# Patient Record
Sex: Female | Born: 1980 | Race: Black or African American | Hispanic: No | Marital: Single | Smoking: Current every day smoker
Health system: Southern US, Community
[De-identification: ages and names within clinical notes are randomized; demographics above are authoritative.]

## PROBLEM LIST (undated history)

## (undated) DIAGNOSIS — M329 Systemic lupus erythematosus, unspecified: Secondary | ICD-10-CM

## (undated) DIAGNOSIS — R569 Unspecified convulsions: Secondary | ICD-10-CM

---

## 2004-03-17 ENCOUNTER — Emergency Department: Payer: Self-pay | Admitting: General Practice

## 2005-08-13 ENCOUNTER — Emergency Department: Payer: Self-pay | Admitting: Emergency Medicine

## 2005-10-06 ENCOUNTER — Emergency Department: Payer: Self-pay | Admitting: Emergency Medicine

## 2005-11-29 ENCOUNTER — Emergency Department (HOSPITAL_COMMUNITY): Admission: EM | Admit: 2005-11-29 | Discharge: 2005-11-29 | Payer: Self-pay | Admitting: Emergency Medicine

## 2006-06-03 ENCOUNTER — Emergency Department (HOSPITAL_COMMUNITY): Admission: EM | Admit: 2006-06-03 | Discharge: 2006-06-04 | Payer: Self-pay | Admitting: Emergency Medicine

## 2006-12-29 ENCOUNTER — Emergency Department (HOSPITAL_COMMUNITY): Admission: EM | Admit: 2006-12-29 | Discharge: 2006-12-29 | Payer: Self-pay | Admitting: *Deleted

## 2007-04-12 ENCOUNTER — Emergency Department (HOSPITAL_COMMUNITY): Admission: EM | Admit: 2007-04-12 | Discharge: 2007-04-12 | Payer: Self-pay | Admitting: Emergency Medicine

## 2007-06-09 ENCOUNTER — Emergency Department (HOSPITAL_COMMUNITY): Admission: EM | Admit: 2007-06-09 | Discharge: 2007-06-09 | Payer: Self-pay | Admitting: Emergency Medicine

## 2007-09-25 ENCOUNTER — Emergency Department (HOSPITAL_COMMUNITY): Admission: EM | Admit: 2007-09-25 | Discharge: 2007-09-26 | Payer: Self-pay | Admitting: Emergency Medicine

## 2009-06-15 ENCOUNTER — Emergency Department (HOSPITAL_COMMUNITY): Admission: EM | Admit: 2009-06-15 | Discharge: 2009-06-15 | Payer: Self-pay | Admitting: Emergency Medicine

## 2009-06-22 ENCOUNTER — Emergency Department (HOSPITAL_COMMUNITY): Admission: EM | Admit: 2009-06-22 | Discharge: 2009-06-22 | Payer: Self-pay | Admitting: Emergency Medicine

## 2009-09-07 ENCOUNTER — Emergency Department (HOSPITAL_COMMUNITY)
Admission: EM | Admit: 2009-09-07 | Discharge: 2009-09-07 | Payer: Self-pay | Source: Home / Self Care | Admitting: Emergency Medicine

## 2009-09-23 ENCOUNTER — Emergency Department (HOSPITAL_COMMUNITY): Admission: EM | Admit: 2009-09-23 | Discharge: 2009-09-23 | Payer: Self-pay | Admitting: Emergency Medicine

## 2010-03-12 ENCOUNTER — Emergency Department (HOSPITAL_COMMUNITY): Admission: EM | Admit: 2010-03-12 | Discharge: 2010-03-13 | Payer: Self-pay | Admitting: Emergency Medicine

## 2010-07-10 LAB — WET PREP, GENITAL
Trich, Wet Prep: NONE SEEN
Yeast Wet Prep HPF POC: NONE SEEN

## 2010-07-10 LAB — GC/CHLAMYDIA PROBE AMP, GENITAL
Chlamydia, DNA Probe: NEGATIVE
GC Probe Amp, Genital: NEGATIVE

## 2010-07-10 LAB — URINALYSIS, ROUTINE W REFLEX MICROSCOPIC
Bilirubin Urine: NEGATIVE
Glucose, UA: NEGATIVE mg/dL
Hgb urine dipstick: NEGATIVE
Ketones, ur: NEGATIVE mg/dL
Protein, ur: NEGATIVE mg/dL

## 2010-07-10 LAB — URINE MICROSCOPIC-ADD ON

## 2010-07-10 LAB — POCT PREGNANCY, URINE: Preg Test, Ur: NEGATIVE

## 2010-07-17 LAB — CBC
MCHC: 33.8 g/dL (ref 30.0–36.0)
MCV: 93.3 fL (ref 78.0–100.0)
RBC: 3.84 MIL/uL — ABNORMAL LOW (ref 3.87–5.11)
RDW: 14.6 % (ref 11.5–15.5)

## 2010-07-17 LAB — DIFFERENTIAL
Basophils Relative: 1 % (ref 0–1)
Eosinophils Absolute: 0.1 10*3/uL (ref 0.0–0.7)
Monocytes Absolute: 0.5 10*3/uL (ref 0.1–1.0)
Monocytes Relative: 11 % (ref 3–12)
Neutrophils Relative %: 42 % — ABNORMAL LOW (ref 43–77)

## 2010-07-17 LAB — BASIC METABOLIC PANEL
BUN: 9 mg/dL (ref 6–23)
Potassium: 4.3 mEq/L (ref 3.5–5.1)
Sodium: 134 mEq/L — ABNORMAL LOW (ref 135–145)

## 2010-08-06 ENCOUNTER — Emergency Department (HOSPITAL_COMMUNITY)
Admission: EM | Admit: 2010-08-06 | Discharge: 2010-08-06 | Disposition: A | Discharge status: Home / Self Care | Payer: Self-pay | Attending: Emergency Medicine | Admitting: Emergency Medicine

## 2010-08-06 DIAGNOSIS — M329 Systemic lupus erythematosus, unspecified: Secondary | ICD-10-CM | POA: Insufficient documentation

## 2010-08-06 DIAGNOSIS — Z79899 Other long term (current) drug therapy: Secondary | ICD-10-CM | POA: Insufficient documentation

## 2010-08-06 DIAGNOSIS — R319 Hematuria, unspecified: Secondary | ICD-10-CM | POA: Insufficient documentation

## 2010-08-06 DIAGNOSIS — N390 Urinary tract infection, site not specified: Secondary | ICD-10-CM | POA: Insufficient documentation

## 2010-08-06 DIAGNOSIS — R21 Rash and other nonspecific skin eruption: Secondary | ICD-10-CM | POA: Insufficient documentation

## 2010-08-06 DIAGNOSIS — IMO0001 Reserved for inherently not codable concepts without codable children: Secondary | ICD-10-CM | POA: Insufficient documentation

## 2010-08-06 LAB — POCT PREGNANCY, URINE: Preg Test, Ur: NEGATIVE

## 2010-08-06 LAB — URINALYSIS, ROUTINE W REFLEX MICROSCOPIC
Ketones, ur: 15 mg/dL — AB
Nitrite: NEGATIVE
Urobilinogen, UA: 1 mg/dL (ref 0.0–1.0)

## 2010-08-06 LAB — CBC
MCH: 30.3 pg (ref 26.0–34.0)
MCHC: 34 g/dL (ref 30.0–36.0)
Platelets: 280 10*3/uL (ref 150–400)

## 2010-08-06 LAB — DIFFERENTIAL
Basophils Relative: 0 % (ref 0–1)
Eosinophils Absolute: 0 10*3/uL (ref 0.0–0.7)
Monocytes Absolute: 0.7 10*3/uL (ref 0.1–1.0)
Monocytes Relative: 9 % (ref 3–12)
Neutrophils Relative %: 55 % (ref 43–77)

## 2010-08-06 LAB — URINE MICROSCOPIC-ADD ON

## 2010-08-06 LAB — BASIC METABOLIC PANEL
BUN: 12 mg/dL (ref 6–23)
Creatinine, Ser: 0.97 mg/dL (ref 0.4–1.2)
GFR calc non Af Amer: >60 mL/min (ref >60)

## 2010-08-06 LAB — CK: Total CK: 136 U/L (ref 7–177)

## 2010-08-07 LAB — URINE CULTURE: Culture: NO GROWTH

## 2010-09-01 ENCOUNTER — Emergency Department (HOSPITAL_COMMUNITY)
Admission: EM | Admit: 2010-09-01 | Discharge: 2010-09-01 | Disposition: A | Discharge status: Home / Self Care | Payer: Self-pay | Attending: Emergency Medicine | Admitting: Emergency Medicine

## 2010-09-01 DIAGNOSIS — N760 Acute vaginitis: Secondary | ICD-10-CM | POA: Insufficient documentation

## 2010-09-01 DIAGNOSIS — R109 Unspecified abdominal pain: Secondary | ICD-10-CM | POA: Insufficient documentation

## 2010-09-01 DIAGNOSIS — M545 Low back pain, unspecified: Secondary | ICD-10-CM | POA: Insufficient documentation

## 2010-09-01 LAB — URINE MICROSCOPIC-ADD ON

## 2010-09-01 LAB — URINALYSIS, ROUTINE W REFLEX MICROSCOPIC
Glucose, UA: NEGATIVE mg/dL
Hgb urine dipstick: NEGATIVE
Protein, ur: NEGATIVE mg/dL
pH: 5.5 (ref 5.0–8.0)

## 2010-09-01 LAB — WET PREP, GENITAL: Yeast Wet Prep HPF POC: NONE SEEN

## 2011-01-23 LAB — POCT I-STAT, CHEM 8
Chloride: 104
Glucose, Bld: 82
HCT: 39
Potassium: 4.7

## 2011-01-23 LAB — CBC
MCHC: 34.1
RBC: 4.07
WBC: 4.8

## 2011-01-23 LAB — DIFFERENTIAL
Basophils Relative: 1
Lymphocytes Relative: 36
Monocytes Relative: 9
Neutro Abs: 2.6
Neutrophils Relative %: 54

## 2011-02-08 LAB — URINALYSIS, ROUTINE W REFLEX MICROSCOPIC
Nitrite: NEGATIVE
Specific Gravity, Urine: 1.023
Urobilinogen, UA: 1

## 2011-02-08 LAB — GC/CHLAMYDIA PROBE AMP, GENITAL: Chlamydia, DNA Probe: NEGATIVE

## 2011-02-08 LAB — URINE MICROSCOPIC-ADD ON

## 2011-02-08 LAB — PREGNANCY, URINE: Preg Test, Ur: NEGATIVE

## 2011-02-08 LAB — RPR: RPR Ser Ql: NONREACTIVE

## 2011-03-14 ENCOUNTER — Encounter: Payer: Self-pay | Admitting: Emergency Medicine

## 2011-03-14 ENCOUNTER — Emergency Department (HOSPITAL_COMMUNITY): Payer: PRIVATE HEALTH INSURANCE

## 2011-03-14 ENCOUNTER — Emergency Department (HOSPITAL_COMMUNITY)
Admission: EM | Admit: 2011-03-14 | Discharge: 2011-03-14 | Disposition: A | Discharge status: Home / Self Care | Payer: PRIVATE HEALTH INSURANCE | Attending: Emergency Medicine | Admitting: Emergency Medicine

## 2011-03-14 DIAGNOSIS — IMO0001 Reserved for inherently not codable concepts without codable children: Secondary | ICD-10-CM | POA: Insufficient documentation

## 2011-03-14 DIAGNOSIS — R51 Headache: Secondary | ICD-10-CM | POA: Insufficient documentation

## 2011-03-14 DIAGNOSIS — R509 Fever, unspecified: Secondary | ICD-10-CM | POA: Insufficient documentation

## 2011-03-14 DIAGNOSIS — R079 Chest pain, unspecified: Secondary | ICD-10-CM | POA: Insufficient documentation

## 2011-03-14 DIAGNOSIS — G40909 Epilepsy, unspecified, not intractable, without status epilepticus: Secondary | ICD-10-CM | POA: Insufficient documentation

## 2011-03-14 DIAGNOSIS — R059 Cough, unspecified: Secondary | ICD-10-CM | POA: Insufficient documentation

## 2011-03-14 DIAGNOSIS — Z79899 Other long term (current) drug therapy: Secondary | ICD-10-CM | POA: Insufficient documentation

## 2011-03-14 DIAGNOSIS — N390 Urinary tract infection, site not specified: Secondary | ICD-10-CM | POA: Insufficient documentation

## 2011-03-14 DIAGNOSIS — M329 Systemic lupus erythematosus, unspecified: Secondary | ICD-10-CM | POA: Insufficient documentation

## 2011-03-14 DIAGNOSIS — J3489 Other specified disorders of nose and nasal sinuses: Secondary | ICD-10-CM | POA: Insufficient documentation

## 2011-03-14 DIAGNOSIS — J069 Acute upper respiratory infection, unspecified: Secondary | ICD-10-CM | POA: Insufficient documentation

## 2011-03-14 DIAGNOSIS — R05 Cough: Secondary | ICD-10-CM | POA: Insufficient documentation

## 2011-03-14 HISTORY — DX: Systemic lupus erythematosus, unspecified: M32.9

## 2011-03-14 HISTORY — DX: Unspecified convulsions: R56.9

## 2011-03-14 LAB — POCT I-STAT, CHEM 8
Calcium, Ion: 1.17 mmol/L (ref 1.12–1.32)
Chloride: 103 mEq/L (ref 96–112)
HCT: 38 % (ref 36.0–46.0)
Potassium: 3.8 mEq/L (ref 3.5–5.1)

## 2011-03-14 LAB — URINALYSIS, ROUTINE W REFLEX MICROSCOPIC
Glucose, UA: NEGATIVE mg/dL
pH: 7.5 (ref 5.0–8.0)

## 2011-03-14 LAB — URINE MICROSCOPIC-ADD ON

## 2011-03-14 LAB — CBC
Hemoglobin: 12.2 g/dL (ref 12.0–15.0)
RBC: 3.95 MIL/uL (ref 3.87–5.11)

## 2011-03-14 LAB — POCT PREGNANCY, URINE: Preg Test, Ur: NEGATIVE

## 2011-03-14 LAB — RAPID STREP SCREEN (MED CTR MEBANE ONLY): Streptococcus, Group A Screen (Direct): NEGATIVE

## 2011-03-14 MED ORDER — ETODOLAC 500 MG PO TABS: 500.0000 mg | ORAL_TABLET | Freq: Two times a day (BID) | ORAL | Status: DC

## 2011-03-14 MED ORDER — HYDROMORPHONE HCL PF 1 MG/ML IJ SOLN
1.0000 mg | Freq: Once | INTRAMUSCULAR | Status: AC
  Administered 2011-03-14: 1 mg via INTRAMUSCULAR
  Filled 2011-03-14: qty 1

## 2011-03-14 MED ORDER — OXYCODONE-ACETAMINOPHEN 5-325 MG PO TABS: 1.0000 | ORAL_TABLET | Freq: Once | ORAL | Status: DC

## 2011-03-14 MED ORDER — HYDROCODONE-ACETAMINOPHEN 5-325 MG PO TABS: 1.0000 | ORAL_TABLET | Freq: Four times a day (QID) | ORAL | Status: AC | PRN

## 2011-03-14 MED ORDER — SULFAMETHOXAZOLE-TRIMETHOPRIM 800-160 MG PO TABS: 1.0000 | ORAL_TABLET | Freq: Two times a day (BID) | ORAL | Status: AC

## 2011-03-14 MED ORDER — GUAIFENESIN ER 1200 MG PO TB12: 1.0000 | ORAL_TABLET | Freq: Two times a day (BID) | ORAL | Status: DC

## 2011-03-14 MED ORDER — DIPHENHYDRAMINE HCL 50 MG/ML IJ SOLN
25.0000 mg | Freq: Once | INTRAMUSCULAR | Status: AC
  Administered 2011-03-14: 25 mg via INTRAMUSCULAR
  Filled 2011-03-14: qty 1

## 2011-03-14 NOTE — ED Notes (Signed)
Doctor at bedside.

## 2011-03-14 NOTE — ED Provider Notes (Signed)
History     CSN: [id number] Arrival date & time: 03/14/2011 10:05 AM   First MD Initiated Contact with Patient 03/14/11 1009      Chief Complaint  Patient presents with  . Cough    (Consider location/radiation/quality/duration/timing/severity/associated sxs/prior treatment) HPI Comments: Patient states she has history of lupus and seizure disorder. She does not have a primary care doctor. She's been feeling aches and pains all over and thinks some of this could be related to her lupus. Patient however has been having coughing and congestion. There has been no vomiting or diarrhea. She does not have any abdominal pain.  Patient is a 30 y.o. female presenting with cough. The history is provided by the patient.  Cough The current episode started 2 days ago. The problem has been gradually worsening. The cough is non-productive. The maximum temperature recorded prior to her arrival was 103 to 104 F. The fever has been present for less than 1 day. Associated symptoms include chest pain, headaches, sore throat and myalgias. Pertinent negatives include no ear congestion, no ear pain, no shortness of breath, no wheezing and no eye redness.    Past Medical History  Diagnosis Date  . Seizures   . Lupus     History reviewed. No pertinent past surgical history.  No family history on file.  History  Substance Use Topics  . Smoking status: Never Smoker   . Smokeless tobacco: Not on file  . Alcohol Use: No    OB History    Grav Para Term Preterm Abortions TAB SAB Ect Mult Living                  Review of Systems  HENT: Positive for sore throat. Negative for ear pain.   Eyes: Negative for redness.  Respiratory: Positive for cough. Negative for shortness of breath and wheezing.   Cardiovascular: Positive for chest pain.  Musculoskeletal: Positive for myalgias.  Neurological: Positive for headaches.  All other systems reviewed and are negative.    Allergies  Review of patient's  allergies indicates no known allergies.  Home Medications   Current Outpatient Rx  Name Route Sig Dispense Refill  . HYDROCODONE-ACETAMINOPHEN 5-500 MG PO TABS Oral Take 1 tablet by mouth every 6 (six) hours as needed.      Marland Kitchen PHENYTOIN 125 MG/5ML PO SUSP Oral Take by mouth 3 (three) times daily.        BP 126/82  Pulse 111  Temp(Src) 98.4 F (36.9 C) (Oral)  Resp 16  SpO2 100%  Physical Exam  Nursing note and vitals reviewed. Constitutional: She appears well-developed and well-nourished. No distress.  HENT:  Head: Normocephalic and atraumatic.  Right Ear: External ear normal.  Left Ear: Tympanic membrane and external ear normal.  Mouth/Throat: Uvula is midline and oropharynx is clear and moist.  Eyes: Conjunctivae are normal. Right eye exhibits no discharge. Left eye exhibits no discharge. No scleral icterus.  Neck: Neck supple. No tracheal deviation present. No Brudzinski's sign and no Kernig's sign noted.  Cardiovascular: Normal rate, regular rhythm and intact distal pulses.   Pulmonary/Chest: Effort normal and breath sounds normal. No accessory muscle usage or stridor. Not tachypneic. No respiratory distress. She has no wheezes. She has no rales.  Abdominal: Soft. Bowel sounds are normal. She exhibits no distension. There is no tenderness. There is no rebound and no guarding.  Musculoskeletal: She exhibits no edema and no tenderness.  Neurological: She is alert. She has normal strength. No sensory deficit. Cranial  nerve deficit:  no gross defecits noted. She exhibits normal muscle tone. She displays no seizure activity. Coordination normal.  Skin: Skin is warm and dry. No rash noted.  Psychiatric: She has a normal mood and affect.    ED Course  Procedures (including critical care time)  Medications  phenytoin (DILANTIN) 125 MG/5ML suspension (not administered)  HYDROcodone-acetaminophen (VICODIN) 5-500 MG per tablet (not administered)  oxyCODONE-acetaminophen (PERCOCET)  5-325 MG per tablet 1 tablet (1 tablet Oral Not Given 03/14/11 1030)  predniSONE (DELTASONE) 20 MG tablet (not administered)  Pediatric Multivit-Minerals-C (CHILDRENS MULTIVITAMIN PO) (not administered)  HYDROcodone-acetaminophen (NORCO) 5-325 MG per tablet (not administered)  etodolac (LODINE) 500 MG tablet (not administered)  Guaifenesin 1200 MG TB12 (not administered)  sulfamethoxazole-trimethoprim (SEPTRA DS) 800-160 MG per tablet (not administered)  HYDROmorphone (DILAUDID) injection 1 mg (1 mg Intramuscular Given 03/14/11 1121)  HYDROmorphone (DILAUDID) injection 1 mg (1 mg Intramuscular Given 03/14/11 1309)  diphenhydrAMINE (BENADRYL) injection 25 mg (25 mg Intramuscular Given 03/14/11 1306)    Labs Reviewed  URINALYSIS, ROUTINE W REFLEX MICROSCOPIC - Abnormal; Notable for the following:    Appearance CLOUDY (*)    Leukocytes, UA LARGE (*)    All other components within normal limits  URINE MICROSCOPIC-ADD ON - Abnormal; Notable for the following:    Squamous Epithelial / LPF MANY (*)    Bacteria, UA MANY (*)    All other components within normal limits  CBC  RAPID STREP SCREEN  POCT I-STAT, CHEM 8  POCT PREGNANCY, URINE  POCT PREGNANCY, URINE  I-STAT, CHEM 8   Dg Chest 2 View  03/14/2011  *RADIOLOGY REPORT*  Clinical Data: Fever, cough, chest pain  CHEST - 2 VIEW  Comparison: 06/07/2007  Findings: Cardiomediastinal silhouette is stable.  Bony thorax is stable.  No acute infiltrate or pleural effusion.  No pulmonary edema.  IMPRESSION:  No active disease.  No significant change.  Original Report Authenticated By: Natasha Mead, M.D.     1. URI, acute   2. UTI (lower urinary tract infection)       MDM  Patient without signs of pneumonia or strep throat. Her laboratory tests did not suggest any signs of dehydration. The patient does appear to have a urinary tract infection. I doubt pyelonephritis with the lack of fever, leukocytosis and flank pain. Patient will be prescribed  a course of Bactrim        Celene Kras, MD 03/14/11 [phone]

## 2011-03-14 NOTE — ED Notes (Signed)
Onset few days ago cough non productive general body achy.

## 2011-03-14 NOTE — ED Notes (Signed)
Pt knows that urine is needed. Pt states that she is not going to void at this time we are making her do to much and that she is sick and she wants to wait until her medication has had time to work for her.

## 2011-03-14 NOTE — ED Notes (Signed)
Patient ambulatory to bathroom and returned steady gait upon returning patient stated felt tired.

## 2011-05-13 ENCOUNTER — Emergency Department (HOSPITAL_COMMUNITY)
Admission: EM | Admit: 2011-05-13 | Discharge: 2011-05-14 | Disposition: A | Discharge status: Home / Self Care | Payer: PRIVATE HEALTH INSURANCE | Attending: Emergency Medicine | Admitting: Emergency Medicine

## 2011-05-13 ENCOUNTER — Encounter (HOSPITAL_COMMUNITY): Payer: Self-pay | Admitting: *Deleted

## 2011-05-13 DIAGNOSIS — Z79899 Other long term (current) drug therapy: Secondary | ICD-10-CM | POA: Insufficient documentation

## 2011-05-13 DIAGNOSIS — G40909 Epilepsy, unspecified, not intractable, without status epilepticus: Secondary | ICD-10-CM | POA: Insufficient documentation

## 2011-05-13 DIAGNOSIS — T07XXXA Unspecified multiple injuries, initial encounter: Secondary | ICD-10-CM | POA: Insufficient documentation

## 2011-05-13 DIAGNOSIS — M546 Pain in thoracic spine: Secondary | ICD-10-CM | POA: Insufficient documentation

## 2011-05-13 DIAGNOSIS — M79609 Pain in unspecified limb: Secondary | ICD-10-CM | POA: Insufficient documentation

## 2011-05-13 DIAGNOSIS — M329 Systemic lupus erythematosus, unspecified: Secondary | ICD-10-CM | POA: Insufficient documentation
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
## 2011-05-14 NOTE — ED Notes (Signed)
Pt. Discharged to home, pt. Alert and oriented, ambulatory, gait steady, NAD noted 

## 2011-05-14 NOTE — ED Notes (Signed)
Received pt. From triage, pt. Alert and oriented, ambulatory, gait steady, NAD noted 
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
## 2011-06-23 ENCOUNTER — Emergency Department (HOSPITAL_COMMUNITY)
Admission: EM | Admit: 2011-06-23 | Discharge: 2011-06-23 | Disposition: A | Discharge status: Home / Self Care | Payer: Self-pay | Attending: Emergency Medicine | Admitting: Emergency Medicine

## 2011-06-23 ENCOUNTER — Emergency Department (HOSPITAL_COMMUNITY): Payer: Self-pay

## 2011-06-23 ENCOUNTER — Encounter (HOSPITAL_COMMUNITY): Payer: Self-pay | Admitting: Emergency Medicine

## 2011-06-23 ENCOUNTER — Other Ambulatory Visit: Payer: Self-pay

## 2011-06-23 DIAGNOSIS — M329 Systemic lupus erythematosus, unspecified: Secondary | ICD-10-CM | POA: Insufficient documentation

## 2011-06-23 DIAGNOSIS — R509 Fever, unspecified: Secondary | ICD-10-CM | POA: Insufficient documentation

## 2011-06-23 DIAGNOSIS — Z91199 Patient's noncompliance with other medical treatment and regimen due to unspecified reason: Secondary | ICD-10-CM | POA: Insufficient documentation

## 2011-06-23 DIAGNOSIS — R079 Chest pain, unspecified: Secondary | ICD-10-CM | POA: Insufficient documentation

## 2011-06-23 DIAGNOSIS — R05 Cough: Secondary | ICD-10-CM | POA: Insufficient documentation

## 2011-06-23 DIAGNOSIS — R059 Cough, unspecified: Secondary | ICD-10-CM | POA: Insufficient documentation

## 2011-06-23 DIAGNOSIS — IMO0001 Reserved for inherently not codable concepts without codable children: Secondary | ICD-10-CM | POA: Insufficient documentation

## 2011-06-23 DIAGNOSIS — Z9119 Patient's noncompliance with other medical treatment and regimen: Secondary | ICD-10-CM | POA: Insufficient documentation

## 2011-06-23 DIAGNOSIS — R0789 Other chest pain: Secondary | ICD-10-CM

## 2011-06-23 DIAGNOSIS — J069 Acute upper respiratory infection, unspecified: Secondary | ICD-10-CM | POA: Insufficient documentation

## 2011-06-23 DIAGNOSIS — R5381 Other malaise: Secondary | ICD-10-CM | POA: Insufficient documentation

## 2011-06-23 DIAGNOSIS — Z9114 Patient's other noncompliance with medication regimen: Secondary | ICD-10-CM

## 2011-06-23 DIAGNOSIS — R569 Unspecified convulsions: Secondary | ICD-10-CM | POA: Insufficient documentation

## 2011-06-23 DIAGNOSIS — J029 Acute pharyngitis, unspecified: Secondary | ICD-10-CM | POA: Insufficient documentation

## 2011-06-23 LAB — POCT I-STAT, CHEM 8
BUN: 6 mg/dL (ref 6–23)
Chloride: 104 mEq/L (ref 96–112)
Creatinine, Ser: 0.8 mg/dL (ref 0.50–1.10)
Glucose, Bld: 90 mg/dL (ref 70–99)
HCT: 37 % (ref 36.0–46.0)
Potassium: 3.8 mEq/L (ref 3.5–5.1)

## 2011-06-23 MED ORDER — PHENYTOIN SODIUM EXTENDED 100 MG PO CAPS: 100.0000 mg | ORAL_CAPSULE | Freq: Three times a day (TID) | ORAL | Status: AC

## 2011-06-23 MED ORDER — NAPROXEN 500 MG PO TABS: 500.0000 mg | ORAL_TABLET | Freq: Two times a day (BID) | ORAL | Status: AC

## 2011-06-23 MED ORDER — OXYCODONE-ACETAMINOPHEN 5-325 MG PO TABS
1.0000 | ORAL_TABLET | Freq: Once | ORAL | Status: AC
  Administered 2011-06-23: 1 via ORAL
  Filled 2011-06-23: qty 1

## 2011-06-23 MED ORDER — ALBUTEROL SULFATE HFA 108 (90 BASE) MCG/ACT IN AERS
2.0000 | INHALATION_SPRAY | RESPIRATORY_TRACT | Status: DC | PRN
  Administered 2011-06-23: 2 via RESPIRATORY_TRACT
  Filled 2011-06-23: qty 6.7

## 2011-06-23 MED ORDER — HYDROCODONE-ACETAMINOPHEN 5-500 MG PO TABS: 1.0000 | ORAL_TABLET | Freq: Four times a day (QID) | ORAL | Status: AC | PRN

## 2011-06-23 MED ORDER — PHENYTOIN SODIUM EXTENDED 100 MG PO CAPS
300.0000 mg | ORAL_CAPSULE | Freq: Once | ORAL | Status: AC
  Administered 2011-06-23: 300 mg via ORAL
  Filled 2011-06-23: qty 3

## 2011-06-23 MED ORDER — IBUPROFEN 800 MG PO TABS
800.0000 mg | ORAL_TABLET | Freq: Once | ORAL | Status: AC
  Administered 2011-06-23: 800 mg via ORAL
  Filled 2011-06-23: qty 1

## 2011-06-23 NOTE — ED Provider Notes (Signed)
History     CSN: [id number]  Arrival date & time 06/23/11  1646   First MD Initiated Contact with Patient 06/23/11 1730      Chief Complaint  Patient presents with  . URI  . Chest Pain    (Consider location/radiation/quality/duration/timing/severity/associated sxs/prior treatment) Patient is a 31 y.o. female presenting with URI. The history is provided by the patient. No language interpreter was used.  URI The primary symptoms include fever, fatigue, sore throat, cough and myalgias. Primary symptoms do not include headaches, abdominal pain, nausea, vomiting or arthralgias. The current episode started 3 to 5 days ago. This is a new problem. The problem has been gradually worsening.  The fever began yesterday. The fever has been gradually worsening since its onset. The maximum temperature recorded prior to her arrival was 101 to 101.9 F.  The fatigue began 3 to 5 days ago. The fatigue has been worsening since its onset.  The sore throat began 2 days ago. The sore throat has been gradually worsening since its onset. The sore throat is mild in intensity.  The cough began 3 to 5 days ago. The cough is new. The cough is dry.  Myalgias began 3 to 5 days ago. The myalgias have been gradually worsening since their onset. The myalgias are generalized. The myalgias are not associated with weakness.  Symptoms associated with the illness include congestion and rhinorrhea. The following treatments were addressed: Acetaminophen was ineffective. A decongestant was ineffective. NSAIDs were ineffective.    Past Medical History  Diagnosis Date  . Seizures   . Lupus     History reviewed. No pertinent past surgical history.  No family history on file.  History  Substance Use Topics  . Smoking status: Current Everyday Smoker  . Smokeless tobacco: Not on file  . Alcohol Use: No    OB History    Grav Para Term Preterm Abortions TAB SAB Ect Mult Living                  Review of Systems    Constitutional: Positive for fever, activity change, appetite change and fatigue.  HENT: Positive for congestion, sore throat and rhinorrhea. Negative for neck pain and neck stiffness.   Respiratory: Positive for cough. Negative for shortness of breath.   Cardiovascular: Positive for chest pain. Negative for palpitations.  Gastrointestinal: Negative for nausea, vomiting and abdominal pain.  Genitourinary: Negative for dysuria, urgency, frequency and flank pain.  Musculoskeletal: Positive for myalgias. Negative for back pain and arthralgias.  Neurological: Negative for dizziness, weakness, light-headedness, numbness and headaches.  All other systems reviewed and are negative.    Allergies  Review of patient's allergies indicates no known allergies.  Home Medications   Current Outpatient Rx  Name Route Sig Dispense Refill  . ETODOLAC 500 MG PO TABS Oral Take 500 mg by mouth 2 (two) times daily.    . GUAIFENESIN ER 1200 MG PO TB12 Oral Take 1 tablet by mouth 2 (two) times daily.    Marland Kitchen CHILDRENS MULTIVITAMIN PO Oral Take 1 tablet by mouth daily.     Marland Kitchen PHENYTOIN SODIUM EXTENDED 100 MG PO CAPS Oral Take 100 mg by mouth 3 (three) times daily.    Marland Kitchen PREDNISONE 20 MG PO TABS Oral Take 20 mg by mouth daily.     Marland Kitchen HYDROCODONE-ACETAMINOPHEN 5-500 MG PO TABS Oral Take 1-2 tablets by mouth every 6 (six) hours as needed for pain. 8 tablet 0  . NAPROXEN 500 MG PO TABS Oral  Take 1 tablet (500 mg total) by mouth 2 (two) times daily. 30 tablet 0  . PHENYTOIN SODIUM EXTENDED 100 MG PO CAPS Oral Take 1 capsule (100 mg total) by mouth 3 (three) times daily. 90 capsule 2    BP 131/87  Pulse 108  Temp(Src) 98.7 F (37.1 C) (Oral)  Resp 20  SpO2 98%  LMP 05/30/2011  Physical Exam  Nursing note and vitals reviewed. Constitutional: She is oriented to person, place, and time. She appears well-developed and well-nourished. No distress.  HENT:  Head: Normocephalic and atraumatic.  Mouth/Throat:  Oropharynx is clear and moist.  Eyes: Conjunctivae and EOM are normal. Pupils are equal, round, and reactive to light.  Neck: Normal range of motion. Neck supple.  Cardiovascular: Normal rate, regular rhythm, normal heart sounds and intact distal pulses.  Exam reveals no gallop and no friction rub.   No murmur heard. Pulmonary/Chest: Effort normal. She has wheezes (with cough otherwise clear). She exhibits tenderness (diffuse).  Abdominal: Soft. Bowel sounds are normal. There is no tenderness.  Musculoskeletal: Normal range of motion. She exhibits no tenderness.  Neurological: She is alert and oriented to person, place, and time. No cranial nerve deficit.  Skin: Skin is warm and dry. No rash noted.    ED Course  Procedures (including critical care time)   Date: 06/23/2011  Rate: 94  Rhythm: normal sinus rhythm  QRS Axis: normal  Intervals: normal  ST/T Wave abnormalities: normal  Conduction Disutrbances:none  Narrative Interpretation:   Old EKG Reviewed: none available  Labs Reviewed  PHENYTOIN LEVEL, TOTAL - Abnormal; Notable for the following:    Phenytoin Lvl <2.5 (*)    All other components within normal limits  POCT I-STAT, CHEM 8   Dg Chest 2 View  06/23/2011  *RADIOLOGY REPORT*  Clinical Data: 31 year old female with chest pain and shortness of breath.  CHEST - 2 VIEW  Comparison: 03/14/2011  Findings: The cardiomediastinal silhouette is unremarkable. The lungs are clear. There is no evidence of focal airspace disease, pulmonary edema, suspicious pulmonary nodule/mass, pleural effusion, or pneumothorax. No acute bony abnormalities are identified.  IMPRESSION: No evidence of active cardiopulmonary disease.  Original Report Authenticated By: Rosendo Gros, M.D.     1. Viral URI with cough   2. Chest wall pain   3. Non compliance w medication regimen       MDM  Symptoms are consistent with a viral upper respiratory illness with associated chest wall discomfort. Patient  has been noncompliant with her medications Dilantin. Dilantin level is low. She received a 300 mg loading emergency department. Instructed to continue as directed. I provided a prescription for Dilantin. I have no concern about a malignant cause of chest pain such as ACS or PE. Her symptoms are secondary to viral illness for which she has associated symptoms.        Dayton Bailiff, MD 06/23/11 1946

## 2011-06-23 NOTE — Discharge Instructions (Signed)

## 2011-06-23 NOTE — ED Notes (Signed)
Pt requests pain medicine for chest pain.  EDP notified.

## 2011-06-23 NOTE — ED Notes (Signed)
C/o productive cough with "bright green" sputum, runny nose, sore throat and fever x 3 days.  Pt reports she had a seizure last night- history of same.  Reports stabbing R sided chest pain x 2 hours.

## 2020-12-14 ENCOUNTER — Emergency Department
Admission: EM | Admit: 2020-12-14 | Discharge: 2020-12-14 | Disposition: A | Discharge status: Home / Self Care | Payer: BLUE CROSS/BLUE SHIELD | Attending: Emergency Medicine | Admitting: Emergency Medicine

## 2020-12-14 ENCOUNTER — Emergency Department: Payer: BLUE CROSS/BLUE SHIELD

## 2020-12-14 ENCOUNTER — Encounter: Payer: Self-pay | Admitting: *Deleted

## 2020-12-14 DIAGNOSIS — R079 Chest pain, unspecified: Secondary | ICD-10-CM | POA: Diagnosis present

## 2020-12-14 DIAGNOSIS — M25511 Pain in right shoulder: Secondary | ICD-10-CM | POA: Diagnosis not present

## 2020-12-14 DIAGNOSIS — M549 Dorsalgia, unspecified: Secondary | ICD-10-CM | POA: Insufficient documentation

## 2020-12-14 DIAGNOSIS — Z5321 Procedure and treatment not carried out due to patient leaving prior to being seen by health care provider: Secondary | ICD-10-CM | POA: Insufficient documentation
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
